# Patient Record
Sex: Female | Born: 1935 | Race: White | Hispanic: No | Marital: Single | Smoking: Former smoker
Health system: Southern US, Community
[De-identification: ages and names within clinical notes are randomized; demographics above are authoritative.]

## PROBLEM LIST (undated history)

## (undated) DIAGNOSIS — N189 Chronic kidney disease, unspecified: Secondary | ICD-10-CM

## (undated) DIAGNOSIS — L719 Rosacea, unspecified: Secondary | ICD-10-CM

## (undated) DIAGNOSIS — M1990 Unspecified osteoarthritis, unspecified site: Secondary | ICD-10-CM

## (undated) DIAGNOSIS — I10 Essential (primary) hypertension: Secondary | ICD-10-CM

## (undated) DIAGNOSIS — K219 Gastro-esophageal reflux disease without esophagitis: Secondary | ICD-10-CM

## (undated) DIAGNOSIS — G459 Transient cerebral ischemic attack, unspecified: Secondary | ICD-10-CM

## (undated) DIAGNOSIS — I359 Nonrheumatic aortic valve disorder, unspecified: Secondary | ICD-10-CM

## (undated) DIAGNOSIS — E119 Type 2 diabetes mellitus without complications: Secondary | ICD-10-CM

## (undated) HISTORY — PX: TONSILLECTOMY: SUR1361

---

## 2007-06-11 ENCOUNTER — Ambulatory Visit: Payer: Self-pay | Admitting: Family Medicine

## 2007-06-13 ENCOUNTER — Ambulatory Visit: Payer: Self-pay | Admitting: Internal Medicine

## 2008-11-29 ENCOUNTER — Ambulatory Visit: Payer: Self-pay | Admitting: Internal Medicine

## 2010-06-26 ENCOUNTER — Ambulatory Visit: Payer: Self-pay | Admitting: Internal Medicine

## 2011-05-23 ENCOUNTER — Ambulatory Visit: Payer: Self-pay

## 2014-06-21 ENCOUNTER — Ambulatory Visit
Admit: 2014-06-21 | Discharge: 2014-06-21 | Disposition: A | Discharge status: Home / Self Care | Payer: Medicare Other | Attending: Family Medicine | Admitting: Family Medicine

## 2014-06-21 ENCOUNTER — Emergency Department
Admission: EM | Admit: 2014-06-21 | Discharge: 2014-06-21 | Disposition: A | Discharge status: Home / Self Care | Payer: Medicare Other | Attending: Emergency Medicine | Admitting: Emergency Medicine

## 2014-06-21 ENCOUNTER — Emergency Department: Payer: Medicare Other

## 2014-06-21 ENCOUNTER — Encounter: Payer: Self-pay | Admitting: Emergency Medicine

## 2014-06-21 ENCOUNTER — Encounter: Payer: Self-pay | Admitting: Family Medicine

## 2014-06-21 ENCOUNTER — Ambulatory Visit (INDEPENDENT_AMBULATORY_CARE_PROVIDER_SITE_OTHER)
Admission: EM | Admit: 2014-06-21 | Discharge: 2014-06-21 | Disposition: A | Discharge status: Home / Self Care | Payer: Medicare Other | Source: Home / Self Care | Attending: Family Medicine | Admitting: Family Medicine

## 2014-06-21 DIAGNOSIS — Z87891 Personal history of nicotine dependence: Secondary | ICD-10-CM | POA: Insufficient documentation

## 2014-06-21 DIAGNOSIS — E041 Nontoxic single thyroid nodule: Secondary | ICD-10-CM

## 2014-06-21 DIAGNOSIS — I82412 Acute embolism and thrombosis of left femoral vein: Secondary | ICD-10-CM | POA: Diagnosis not present

## 2014-06-21 DIAGNOSIS — S40022A Contusion of left upper arm, initial encounter: Secondary | ICD-10-CM | POA: Diagnosis not present

## 2014-06-21 DIAGNOSIS — I82402 Acute embolism and thrombosis of unspecified deep veins of left lower extremity: Secondary | ICD-10-CM

## 2014-06-21 DIAGNOSIS — E119 Type 2 diabetes mellitus without complications: Secondary | ICD-10-CM | POA: Diagnosis not present

## 2014-06-21 DIAGNOSIS — R2242 Localized swelling, mass and lump, left lower limb: Secondary | ICD-10-CM | POA: Diagnosis present

## 2014-06-21 DIAGNOSIS — I82621 Acute embolism and thrombosis of deep veins of right upper extremity: Secondary | ICD-10-CM

## 2014-06-21 DIAGNOSIS — I10 Essential (primary) hypertension: Secondary | ICD-10-CM | POA: Insufficient documentation

## 2014-06-21 DIAGNOSIS — I82622 Acute embolism and thrombosis of deep veins of left upper extremity: Secondary | ICD-10-CM

## 2014-06-21 DIAGNOSIS — M7989 Other specified soft tissue disorders: Secondary | ICD-10-CM

## 2014-06-21 DIAGNOSIS — Z79899 Other long term (current) drug therapy: Secondary | ICD-10-CM | POA: Insufficient documentation

## 2014-06-21 HISTORY — DX: Essential (primary) hypertension: I10

## 2014-06-21 HISTORY — DX: Rosacea, unspecified: L71.9

## 2014-06-21 HISTORY — DX: Type 2 diabetes mellitus without complications: E11.9

## 2014-06-21 HISTORY — DX: Transient cerebral ischemic attack, unspecified: G45.9

## 2014-06-21 LAB — COMPREHENSIVE METABOLIC PANEL
ALBUMIN: 3.5 g/dL (ref 3.5–5.0)
ALK PHOS: 75 U/L (ref 38–126)
ALT: 9 U/L — ABNORMAL LOW (ref 14–54)
AST: 18 U/L (ref 15–41)
Anion gap: 7 (ref 5–15)
BUN: 17 mg/dL (ref 6–20)
CO2: 29 mmol/L (ref 22–32)
Calcium: 9.2 mg/dL (ref 8.9–10.3)
Chloride: 103 mmol/L (ref 101–111)
Creatinine, Ser: 1.33 mg/dL — ABNORMAL HIGH (ref 0.44–1.00)
GFR calc Af Amer: 40 mL/min — ABNORMAL LOW (ref >60)
GFR, EST NON AFRICAN AMERICAN: 35 mL/min — AB (ref >60)
Glucose, Bld: 121 mg/dL — ABNORMAL HIGH (ref 65–99)
POTASSIUM: 3.4 mmol/L — AB (ref 3.5–5.1)
Sodium: 139 mmol/L (ref 135–145)
Total Bilirubin: 0.3 mg/dL (ref 0.3–1.2)
Total Protein: 6.2 g/dL — ABNORMAL LOW (ref 6.5–8.1)

## 2014-06-21 LAB — CBC
HCT: 38.6 % (ref 35.0–47.0)
Hemoglobin: 12.7 g/dL (ref 12.0–16.0)
MCH: 28.5 pg (ref 26.0–34.0)
MCHC: 33 g/dL (ref 32.0–36.0)
MCV: 86.5 fL (ref 80.0–100.0)
PLATELETS: 201 10*3/uL (ref 150–440)
RBC: 4.46 MIL/uL (ref 3.80–5.20)
RDW: 13.8 % (ref 11.5–14.5)
WBC: 7.7 10*3/uL (ref 3.6–11.0)

## 2014-06-21 MED ORDER — RIVAROXABAN 15 MG PO TABS
15.0000 mg | ORAL_TABLET | Freq: Once | ORAL | Status: AC
  Administered 2014-06-21: 15 mg via ORAL
  Filled 2014-06-21: qty 1

## 2014-06-21 MED ORDER — RIVAROXABAN (XARELTO) VTE STARTER PACK (15 & 20 MG): ORAL_TABLET | ORAL | Status: DC

## 2014-06-21 NOTE — ED Provider Notes (Signed)
CSN: [id number]     Arrival date & time 06/21/14  1127 History   First MD Initiated Contact with Patient 06/21/14 1236     Chief Complaint  Patient presents with  . Arm Swelling    today   (Consider location/radiation/quality/duration/timing/severity/associated sxs/prior Treatment) HPI Comments: 81 yo female presents with 5 days h/o left upper extremity bruising and tenderness as well as swelling of extremity since this morning. No other new complaints. Patient states on Sunday took a bath in her bathtub, then tried to get out but couldn't. States her son had to get her out of the bathtub. Woke up with sore muscles the next morning. Since then symptoms have progressed on the left side.   The history is provided by the patient.    Past Medical History  Diagnosis Date  . Diabetes mellitus without complication   . TIA (transient ischemic attack)   . Hypertension   . Rosacea    Past Surgical History  Procedure Laterality Date  . Tonsillectomy     Family History  Problem Relation Age of Onset  . Cancer Mother    History  Substance Use Topics  . Smoking status: Former Games developermoker  . Smokeless tobacco: Not on file  . Alcohol Use: No   OB History    Gravida Para Term Preterm AB TAB SAB Ectopic Multiple Living   7 2             Review of Systems  Allergies  Sulfur  Home Medications   Prior to Admission medications   Medication Sig Start Date End Date Taking? Authorizing Provider  clopidogrel (PLAVIX) 75 MG tablet Take 75 mg by mouth daily.   Yes Historical Provider, MD  doxycycline (MONODOX) 50 MG capsule Take 50 mg by mouth 2 (two) times daily.   Yes Historical Provider, MD  furosemide (LASIX) 10 MG/ML solution Take 10 mg by mouth 2 (two) times daily.   Yes Historical Provider, MD  traMADol (ULTRAM) 50 MG tablet Take 50 mg by mouth every 6 (six) hours as needed.   Yes Historical Provider, MD   BP 140/75 mmHg  Pulse 75  Temp(Src) 97.4 F (36.3 C) (Oral)  Resp 18  Ht 5\' 7"   (1.702 m)  Wt 150 lb (68.04 kg)  BMI 23.49 kg/m2  SpO2 96% Physical Exam  Constitutional: She appears well-developed and well-nourished. No distress.  Cardiovascular: Normal rate, regular rhythm and intact distal pulses.   Murmur heard. Pulmonary/Chest: Effort normal and breath sounds normal. No respiratory distress. She has no wheezes. She has no rales. She exhibits no tenderness.  Musculoskeletal: She exhibits edema and tenderness.  Left upper extremity with diffuse ecchymosis noted on skin over left biceps area; tenderness over this area (soft tissues); no mass palpated; no bony tenderness; extremity neurovascularly intact  Skin: She is not diaphoretic.  Vitals reviewed.   ED Course  Procedures (including critical care time) Labs Review Labs Reviewed - No data to display  Imaging Review No results found.   MDM   1. Contusion, arm, upper, left, initial encounter   2. Left arm swelling    Plan: 1. Diagnosis reviewed with patient; discussed all symptoms could be due to contusion, however can not rule out DVT 2. Recommend referral for venous doppler US of left upper extremity (today; patient scheduled for 2pm today) 3. Recommend supportive treatment with warm compresses to area; elevation 4. F/u prn if symptoms worsen or don't improve    Payton Mccallumrlando Sande Pickert, MD 06/21/14 1425

## 2014-06-21 NOTE — ED Notes (Signed)
Patient to ER for c/o clots to left neck. Patient has ultrasound copy with her from urgent care with clot present in jugular vein, subclavian vein, basilic vein, and brachial vein. Patient states upon waking this am she noticed soreness to left neck with swelling to left arm.

## 2014-06-21 NOTE — Discharge Instructions (Signed)
Take Xarelto for your blood clot in both your left arm and left leg. The blood clot in the left leg is not fully occlusive. Stop taking the Plavix. Follow-up with your regular doctors. Return to the emergency department if you have any worsening symptoms of swelling or pain or other urgent concerns.  Deep Vein Thrombosis A deep vein thrombosis (DVT) is a blood clot that develops in the deep, larger veins of the leg, arm, or pelvis. These are more dangerous than clots that might form in veins near the surface of the body. A DVT can lead to serious and even life-threatening complications if the clot breaks off and travels in the bloodstream to the lungs.  A DVT can damage the valves in your leg veins so that instead of flowing upward, the blood pools in the lower leg. This is called post-thrombotic syndrome, and it can result in pain, swelling, discoloration, and sores on the leg. CAUSES Usually, several things contribute to the formation of blood clots. Contributing factors include:  The flow of blood slows down.  The inside of the vein is damaged in some way.  You have a condition that makes blood clot more easily. RISK FACTORS Some people are more likely than others to develop blood clots. Risk factors include:   Smoking.  Being overweight (obese).  Sitting or lying still for a long time. This includes long-distance travel, paralysis, or recovery from an illness or surgery. Other factors that increase risk are:   Older age, especially over 37 years of age.  Having a family history of blood clots or if you have already had a blot clot.  Having major or lengthy surgery. This is especially true for surgery on the hip, knee, or belly (abdomen). Hip surgery is particularly high risk.  Having a long, thin tube (catheter) placed inside a vein during a medical procedure.  Breaking a hip or leg.  Having cancer or cancer treatment.  Pregnancy and childbirth.  Hormone changes make the  blood clot more easily during pregnancy.  The fetus puts pressure on the veins of the pelvis.  There is a risk of injury to veins during delivery or a caesarean delivery. The risk is highest just after childbirth.  Medicines containing the female hormone estrogen. This includes birth control pills and hormone replacement therapy.  Other circulation or heart problems.  SIGNS AND SYMPTOMS When a clot forms, it can either partially or totally block the blood flow in that vein. Symptoms of a DVT can include:  Swelling of the leg or arm, especially if one side is much worse.  Warmth and redness of the leg or arm, especially if one side is much worse.  Pain in an arm or leg. If the clot is in the leg, symptoms may be more noticeable or worse when standing or walking. The symptoms of a DVT that has traveled to the lungs (pulmonary embolism, PE) usually start suddenly and include:  Shortness of breath.  Coughing.  Coughing up blood or blood-tinged mucus.  Chest pain. The chest pain is often worse with deep breaths.  Rapid heartbeat. Anyone with these symptoms should get emergency medical treatment right away. Do not wait to see if the symptoms will go away. Call your local emergency services (911 in the [place] if you have these symptoms. Do not drive yourself to the hospital. DIAGNOSIS If a DVT is suspected, your health care provider will take a full medical history and perform a physical exam. Tests that also  may be required include:  Blood tests, including studies of the clotting properties of the blood.  Ultrasound to see if you have clots in your legs or lungs.  X-rays to show the flow of blood when dye is injected into the veins (venogram).  Studies of your lungs if you have any chest symptoms. PREVENTION  Exercise the legs regularly. Take a brisk 30-minute walk every day.  Maintain a weight that is appropriate for your height.  Avoid sitting or lying in bed for long periods  of time without moving your legs.  Women, particularly those over the age of 35 years, should consider the risks and benefits of taking estrogen medicines, including birth control pills.  Do not smoke, especially if you take estrogen medicines.  Long-distance travel can increase your risk of DVT. You should exercise your legs by walking or pumping the muscles every hour.  Many of the risk factors above relate to situations that exist with hospitalization, either for illness, injury, or elective surgery. Prevention may include medical and nonmedical measures.  Your health care provider will assess you for the need for venous thromboembolism prevention when you are admitted to the hospital. If you are having surgery, your surgeon will assess you the day of or day after surgery. TREATMENT Once identified, a DVT can be treated. It can also be prevented in some circumstances. Once you have had a DVT, you may be at increased risk for a DVT in the future. The most common treatment for DVT is blood-thinning (anticoagulant) medicine, which reduces the blood's tendency to clot. Anticoagulants can stop new blood clots from forming and stop old clots from growing. They cannot dissolve existing clots. Your body does this by itself over time. Anticoagulants can be given by mouth, through an IV tube, or by injection. Your health care provider will determine the best program for you. Other medicines or treatments that may be used are:  Heparin or related medicines (low molecular weight heparin) are often the first treatment for a blood clot. They act quickly. However, they cannot be taken orally and must be given either in shot form or by IV tube.  Heparin can cause a fall in a component of blood that stops bleeding and forms blood clots (platelets). You will be monitored with blood tests to be sure this does not occur.  Warfarin is an anticoagulant that can be swallowed. It takes a few days to start working, so  usually heparin or related medicines are used in combination. Once warfarin is working, heparin is usually stopped.  Factor Xa inhibitor medicines, such as rivaroxaban and apixaban, also reduce blood clotting. These medicines are taken orally and can often be used without heparin or related medicines.  Less commonly, clot dissolving drugs (thrombolytics) are used to dissolve a DVT. They carry a high risk of bleeding, so they are used mainly in severe cases where your life or a part of your body is threatened.  Very rarely, a blood clot in the leg needs to be removed surgically.  If you are unable to take anticoagulants, your health care provider may arrange for you to have a filter placed in a main vein in your abdomen. This filter prevents clots from traveling to your lungs. HOME CARE INSTRUCTIONS  Take all medicines as directed by your health care provider.  Learn as much as you can about DVT.  Wear a medical alert bracelet or carry a medical alert card.  Ask your health care provider how soon  you can go back to normal activities. It is important to stay active to prevent blood clots. If you are on anticoagulant medicine, avoid contact sports.  It is very important to exercise. This is especially important while traveling, sitting, or standing for long periods of time. Exercise your legs by walking or by tightening and relaxing your leg muscles regularly. Take frequent walks.  You may need to wear compression stockings. These are tight elastic stockings that apply pressure to the lower legs. This pressure can help keep the blood in the legs from clotting. Taking Warfarin Warfarin is a daily medicine that is taken by mouth. Your health care provider will advise you on the length of treatment (usually 3-6 months, sometimes lifelong). If you take warfarin:  Understand how to take warfarin and foods that can affect how warfarin works in Public relations account executive.  Too much and too little warfarin are both  dangerous. Too much warfarin increases the risk of bleeding. Too little warfarin continues to allow the risk for blood clots. Warfarin and Regular Blood Testing While taking warfarin, you will need to have regular blood tests to measure your blood clotting time. These blood tests usually include both the prothrombin time (PT) and international normalized ratio (INR) tests. The PT and INR results allow your health care provider to adjust your dose of warfarin. It is very important that you have your PT and INR tested as often as directed by your health care provider.  Warfarin and Your Diet Avoid major changes in your diet, or notify your health care provider before changing your diet. Arrange a visit with a registered dietitian to answer your questions. Many foods, especially foods high in vitamin K, can interfere with warfarin and affect the PT and INR results. You should eat a consistent amount of foods high in vitamin K. Foods high in vitamin K include:   Spinach, kale, broccoli, cabbage, collard and turnip greens, Brussels sprouts, peas, cauliflower, seaweed, and parsley.  Beef and pork liver.  Green tea.  Soybean oil. Warfarin with Other Medicines Many medicines can interfere with warfarin and affect the PT and INR results. You must:  Tell your health care provider about any and all medicines, vitamins, and supplements you take, including aspirin and other over-the-counter anti-inflammatory medicines. Be especially cautious with aspirin and anti-inflammatory medicines. Ask your health care provider before taking these.  Do not take or discontinue any prescribed or over-the-counter medicine except on the advice of your health care provider or pharmacist. Warfarin Side Effects Warfarin can have side effects, such as easy bruising and difficulty stopping bleeding. Ask your health care provider or pharmacist about other side effects of warfarin. You will need to:  Hold pressure over cuts for  longer than usual.  Notify your dentist and other health care providers that you are taking warfarin before you undergo any procedures where bleeding may occur. Warfarin with Alcohol and Tobacco   Drinking alcohol frequently can increase the effect of warfarin, leading to excess bleeding. It is best to avoid alcoholic drinks or to consume only very small amounts while taking warfarin. Notify your health care provider if you change your alcohol intake.   Do not use any tobacco products including cigarettes, chewing tobacco, or electronic cigarettes. If you smoke, quit. Ask your health care provider for help with quitting smoking. Alternative Medicines to Warfarin: Factor Xa Inhibitor Medicines  These blood-thinning medicines are taken by mouth, usually for several weeks or longer. It is important to take the medicine  every single day at the same time each day.  There are no regular blood tests required when using these medicines.  There are fewer food and drug interactions than with warfarin.  The side effects of this class of medicine are similar to those of warfarin, including excessive bruising or bleeding. Ask your health care provider or pharmacist about other potential side effects. SEEK MEDICAL CARE IF:  You notice a rapid heartbeat.  You feel weaker or more tired than usual.  You feel faint.  You notice increased bruising.  You feel your symptoms are not getting better in the time expected.  You believe you are having side effects of medicine. SEEK IMMEDIATE MEDICAL CARE IF:  You have chest pain.  You have trouble breathing.  You have new or increased swelling or pain in one leg.  You cough up blood.  You notice blood in vomit, in a bowel movement, or in urine. MAKE SURE YOU:  Understand these instructions.  Will watch your condition.  Will get help right away if you are not doing well or get worse. Document Released: 01/18/2005 Document Revised: 06/04/2013  Document Reviewed: 09/25/2012 [facility] Patient Information 2015 [employer] [place], [place]. This information is not intended to replace advice given to you by your health care provider. Make sure you discuss any questions you have with your health care provider.

## 2014-06-21 NOTE — ED Notes (Signed)
Patient states she woke up this morning with a swollen left arm. She has had some soreness in the shoulder for couple days but no swelling. She has a lump on the bicep that has come up today.

## 2014-06-21 NOTE — ED Provider Notes (Signed)
[facility] Emergency Department Provider Note  ____________________________________________  Time seen: 1700  I have reviewed the triage vital signs and the nursing notes.   HISTORY  Chief Complaint DVT     HPI Carolyn Morales is a 81 y.o. female who is complaining of swelling in her left arm. She wasn't sure if she had injured it while taking a or not on Sunday. Since then she has had some bruising and discomfort. Including discomfort in her neck.  [facility]. They ordered a Doppler ultrasound as an outpatient. This ultrasound done at 2:00 today was positive for DVT. She now presents to the emergency department for any further treatment.     During our interview, the family points out that she has had some problems and swelling with her left knee and leg as well. This was not either detected or addressed earlier.   Past Medical History  Diagnosis Date  . Diabetes mellitus without complication   . TIA (transient ischemic attack)   . Hypertension   . Rosacea     There are no active problems to display for this patient.   Past Surgical History  Procedure Laterality Date  . Tonsillectomy      Current Outpatient Rx  Name  Route  Sig  Dispense  Refill  . clopidogrel (PLAVIX) 75 MG tablet   Oral   Take 75 mg by mouth daily.         Marland Kitchen. doxycycline (MONODOX) 50 MG capsule   Oral   Take 50 mg by mouth 2 (two) times daily.         . furosemide (LASIX) 10 MG/ML solution   Oral   Take 10 mg by mouth 2 (two) times daily.         . traMADol (ULTRAM) 50 MG tablet   Oral   Take 50 mg by mouth every 6 (six) hours as needed.         . Rivaroxaban (XARELTO STARTER PACK) 15 & 20 MG TBPK      Take as directed on package: Start with one 15mg  tablet by mouth twice a day with food. On Day 22, switch to one 20mg  tablet once a day with food.   51 each   0     Allergies Sulfur  Family History  Problem Relation Age of  Onset  . Cancer Mother     Social History History  Substance Use Topics  . Smoking status: Former Games developermoker  . Smokeless tobacco: Not on file  . Alcohol Use: No    Review of Systems  Constitutional: Negative for fever. ENT: Negative for sore throat. Cardiovascular: Negative for chest pain. Respiratory: Negative for shortness of breath. Gastrointestinal: Negative for abdominal pain, vomiting and diarrhea. Genitourinary: Negative for dysuria. Musculoskeletal: Swelling to left arm. There is also swelling noted to the left knee.. Skin: Negative for rash. Neurological: Negative for headaches   10-point ROS otherwise negative.  ____________________________________________   PHYSICAL EXAM:  VITAL SIGNS: ED Triage Vitals  Enc Vitals Group     BP 06/21/14 1631 160/69 mmHg     Pulse Rate 06/21/14 1631 72     Resp 06/21/14 1631 20     Temp 06/21/14 1631 97.9 F (36.6 C)     Temp Source 06/21/14 1631 Oral     SpO2 06/21/14 1631 99 %     Weight 06/21/14 1631 150 lb (68.04 kg)     Height 06/21/14 1631  5\' 2"  (1.575 m)     Head Cir --      Peak Flow --      Pain Score 06/21/14 1632 0     Pain Loc --      Pain Edu? --      Excl. in GC? --     Constitutional: Alert and oriented. Well appearing and in no distress. ENT   Head: Normocephalic and atraumatic.   Nose: No congestion/rhinnorhea.   Mouth/Throat: Mucous membranes are moist. Cardiovascular: Normal rate, regular rhythm. Notable cardiac murmur 2/6.  Respiratory: Normal respiratory effort without tachypnea. Breath sounds are clear and equal bilaterally. No wheezes/rales/rhonchi. Gastrointestinal: Soft and nontender. No distention.  Back: There is no CVA tenderness. Musculoskeletal: Mild swelling to the left arm down to the wrist. There is swelling to the left knee area with minimal edema in the lower leg as well. Neurologic:  Normal speech and language. No gross focal neurologic deficits are appreciated.  Skin:   Skin is warm, dry. No rash noted. There is notable ecchymosis in the left upper arm near the deltoid. Psychiatric: Mood and affect are normal. Speech and behavior are normal.  ____________________________________________    LABS (pertinent positives/negatives)   White count is okay with white blood cell count of 7.7 hemoglobin of 12.7 and platelets of 201. Metabolic panel shows potassium of 3.4, glucose at 121. BUN is 17 with creatinine of 1.3. ____________________________________________    ____________________________________________    RADIOLOGY  Outpatient Doppler of the left upper extremity positive for DVT. Emergency department ultrasound Doppler of the left lower extremity pending.  ----------------------------------------- 7:47 PM on 06/21/2014 -----------------------------------------  Ultrasound of left lower leg shows nonocclusive blood clot present.  ____________________________________________   PROCEDURES   ____________________________________________   INITIAL IMPRESSION / ASSESSMENT AND PLAN / ED COURSE  Patient was noted outpatient ultrasound showing DVT in the left upper extremity. Now with further interview and examination, the patient has a high likelihood of having a DVT in the left lower extremity as well. For clarity of diagnosis we will do an ultrasound now in the emergency department. I had discussed starting anticoagulation with this patient and her family. They understand the risks of anticoagulation and the need for it. The patient does currently take Plavix. We will speak with vascular surgery about whether to continue this or to discontinue it.  ____________________________________________   FINAL CLINICAL IMPRESSION(S) / ED DIAGNOSES  Final diagnoses:  DVT of upper extremity (deep vein thrombosis), right  DVT of lower extremity (deep venous thrombosis), left  Left leg swelling      Darien Ramusavid W Destane Speas, MD 06/21/14 2002

## 2015-11-06 IMAGING — US US EXTREM  UP VENOUS*L*
1 series · 13 of 24 positions shown · non-contrast
Comparison: None.

CLINICAL DATA: Left upper extremity swelling



[Series 1: us extrem up venous*left* · 0.07mm/px · 13 of 74 slices shown]
[im 1/74]
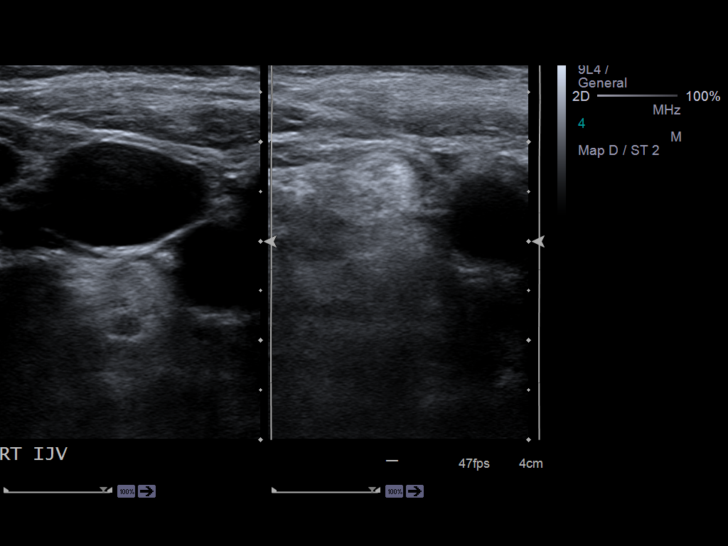
[im 7/74]
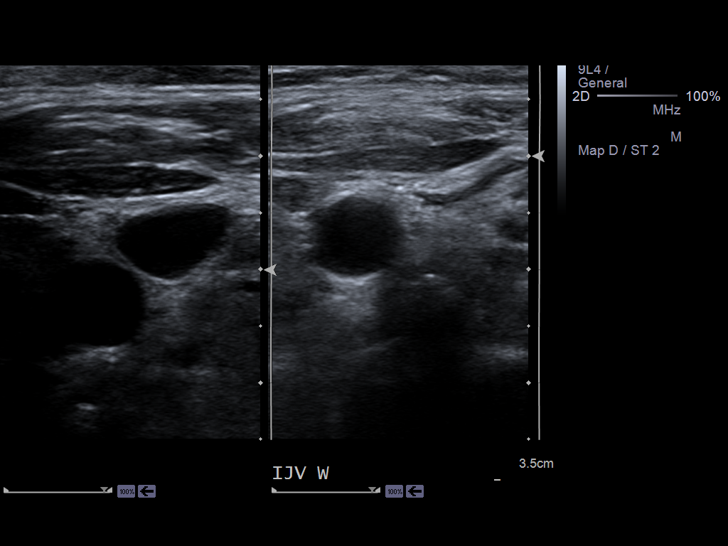
[im 13/74]
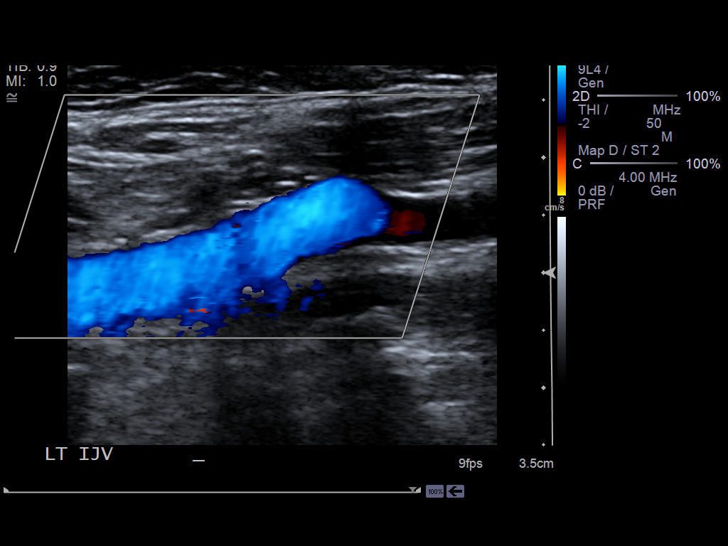
[im 20/74]
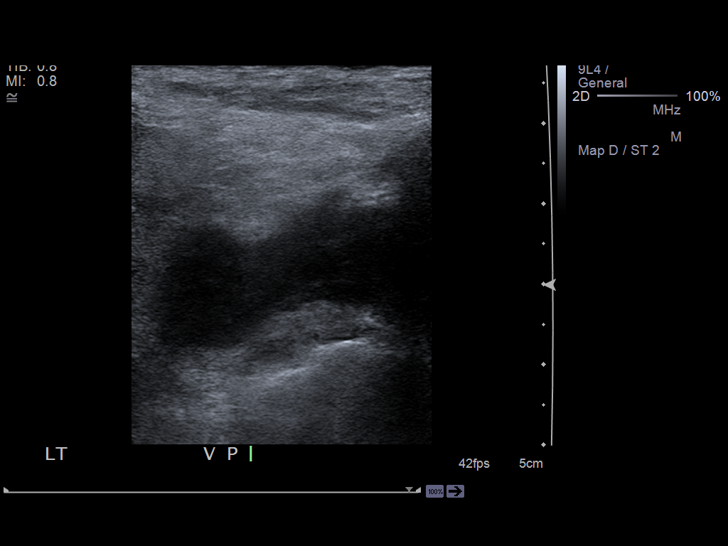
[im 26/74]
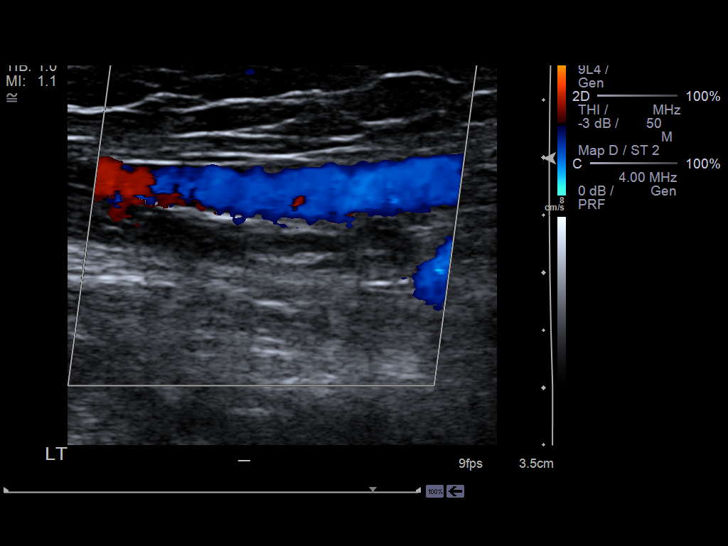
[im 32/74]
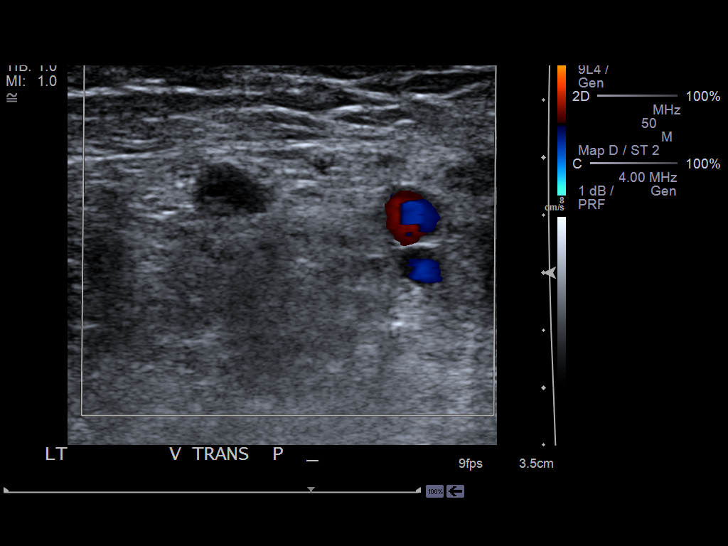
[im 39/74]
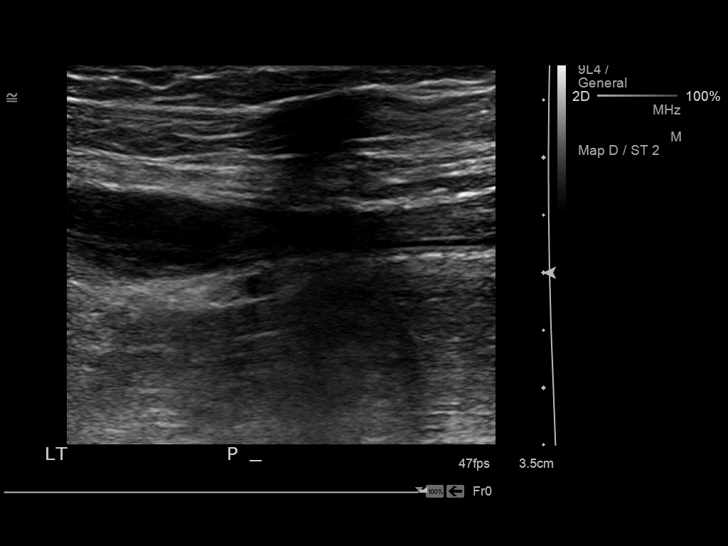
[im 42/74]
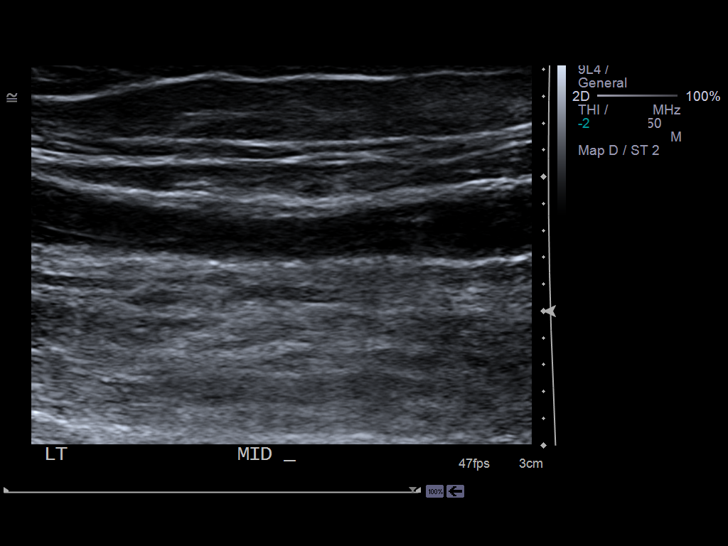
[im 48/74]
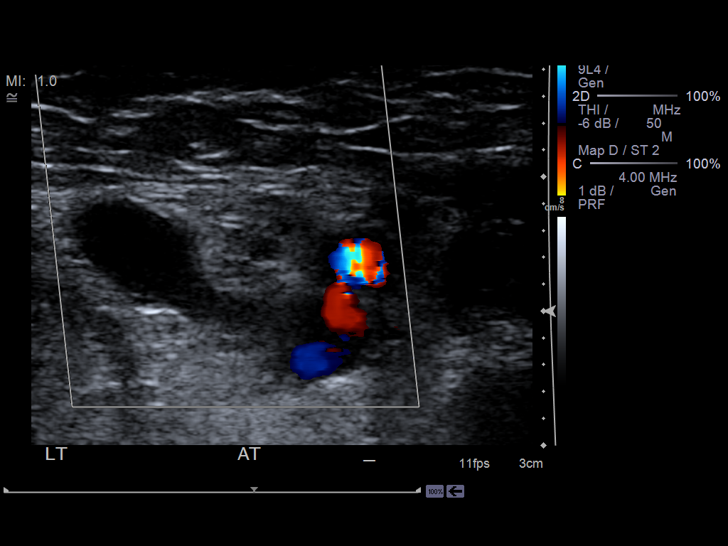
[im 54/74]
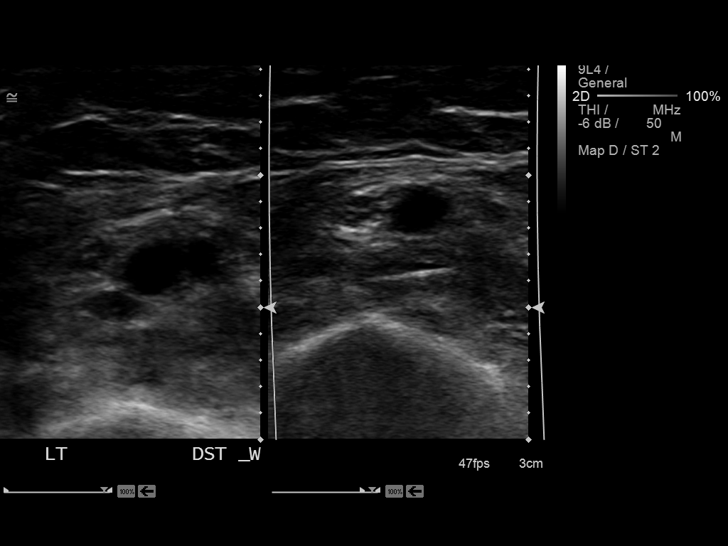
[im 61/74]
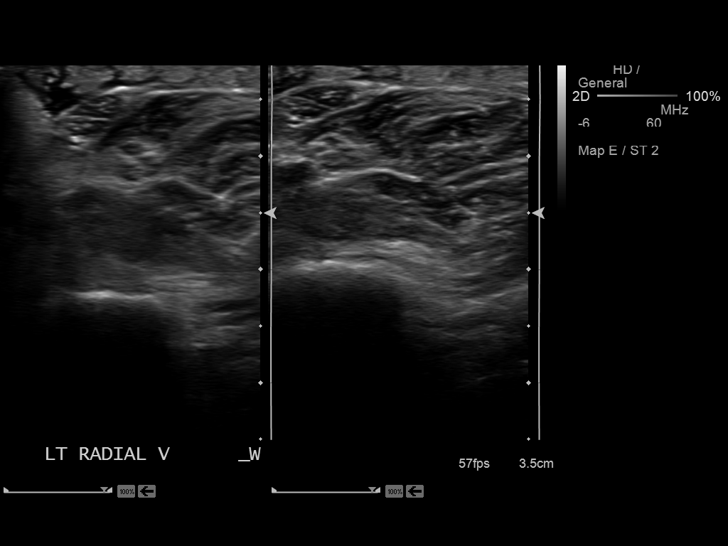
[im 67/74]
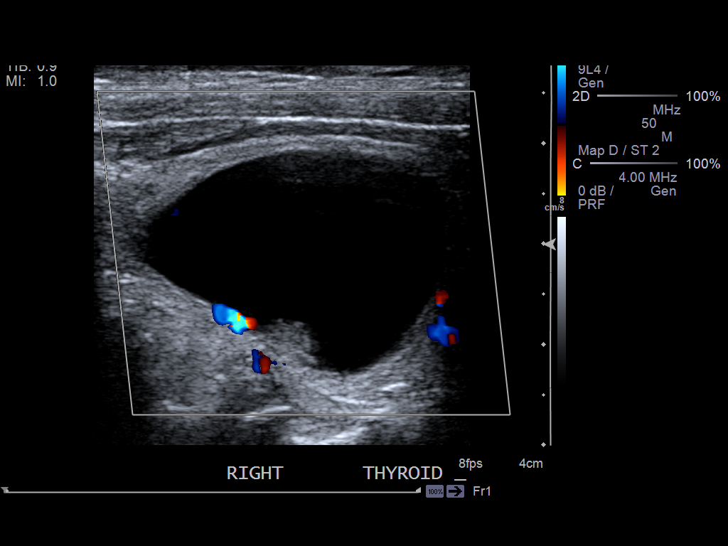
[im 74/74]
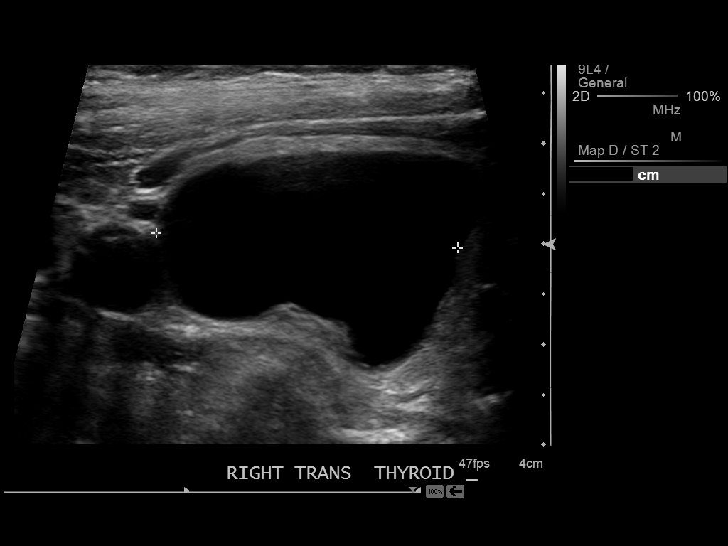

[13 of 24 positions shown; findings below may reference images not displayed]

FINDINGS: Contralateral Subclavian Vein: Respiratory phasicity is normal and
symmetric with the symptomatic side. No evidence of thrombus. Normal
compressibility.

Internal Jugular Vein: Thrombus is noted within the internal jugular
vein with decreased compressibility.

Subclavian Vein: Thrombus is noted within the subclavian vein on the
left.

Axillary Vein: No evidence of thrombus. Normal compressibility,
respiratory phasicity and response to augmentation.

Cephalic Vein: No evidence of thrombus. Normal compressibility,
respiratory phasicity and response to augmentation.

Basilic Vein: Diffuse thrombus is noted throughout the basilic vein.

Brachial Veins: Thrombus is noted within the central right heel pain

Radial Veins: Not well visualized due to peripheral edema

Ulnar Veins: Not well visualized due to peripheral edema.

Venous Reflux:  None visualized.

Other Findings: A large cyst is noted within the right lobe of the
thyroid measuring 3.5 cm in greatest dimension. Only minimal mural
component is noted. This likely represents a degenerated thyroid
nodule.
IMPRESSION: Diffuse left upper extremity deep venous thrombosis as described.

Cystic lesion within the right lobe of the thyroid likely
representing a degenerated nodule.

These results will be called to the ordering clinician or
representative by the Radiologist Assistant, and communication
documented in the PACS or zVision Dashboard.

## 2016-01-21 ENCOUNTER — Ambulatory Visit
Admission: EM | Admit: 2016-01-21 | Discharge: 2016-01-21 | Disposition: A | Discharge status: Home / Self Care | Payer: Medicare Other | Attending: Emergency Medicine | Admitting: Emergency Medicine

## 2016-01-21 ENCOUNTER — Ambulatory Visit: Payer: Medicare Other

## 2016-01-21 ENCOUNTER — Encounter: Payer: Self-pay | Admitting: Emergency Medicine

## 2016-01-21 DIAGNOSIS — W230XXA Caught, crushed, jammed, or pinched between moving objects, initial encounter: Secondary | ICD-10-CM | POA: Diagnosis not present

## 2016-01-21 DIAGNOSIS — R200 Anesthesia of skin: Secondary | ICD-10-CM | POA: Diagnosis present

## 2016-01-21 DIAGNOSIS — N189 Chronic kidney disease, unspecified: Secondary | ICD-10-CM | POA: Insufficient documentation

## 2016-01-21 DIAGNOSIS — I358 Other nonrheumatic aortic valve disorders: Secondary | ICD-10-CM | POA: Diagnosis not present

## 2016-01-21 DIAGNOSIS — L719 Rosacea, unspecified: Secondary | ICD-10-CM | POA: Diagnosis not present

## 2016-01-21 DIAGNOSIS — M1990 Unspecified osteoarthritis, unspecified site: Secondary | ICD-10-CM | POA: Diagnosis not present

## 2016-01-21 DIAGNOSIS — Z87891 Personal history of nicotine dependence: Secondary | ICD-10-CM | POA: Diagnosis not present

## 2016-01-21 DIAGNOSIS — K219 Gastro-esophageal reflux disease without esophagitis: Secondary | ICD-10-CM | POA: Insufficient documentation

## 2016-01-21 DIAGNOSIS — I129 Hypertensive chronic kidney disease with stage 1 through stage 4 chronic kidney disease, or unspecified chronic kidney disease: Secondary | ICD-10-CM | POA: Diagnosis not present

## 2016-01-21 DIAGNOSIS — S61211A Laceration without foreign body of left index finger without damage to nail, initial encounter: Secondary | ICD-10-CM | POA: Insufficient documentation

## 2016-01-21 DIAGNOSIS — Z8673 Personal history of transient ischemic attack (TIA), and cerebral infarction without residual deficits: Secondary | ICD-10-CM | POA: Diagnosis not present

## 2016-01-21 DIAGNOSIS — E1122 Type 2 diabetes mellitus with diabetic chronic kidney disease: Secondary | ICD-10-CM | POA: Insufficient documentation

## 2016-01-21 HISTORY — DX: Gastro-esophageal reflux disease without esophagitis: K21.9

## 2016-01-21 HISTORY — DX: Nonrheumatic aortic valve disorder, unspecified: I35.9

## 2016-01-21 HISTORY — DX: Chronic kidney disease, unspecified: N18.9

## 2016-01-21 HISTORY — DX: Unspecified osteoarthritis, unspecified site: M19.90

## 2016-01-21 MED ORDER — TETANUS-DIPHTHERIA TOXOIDS TD 5-2 LFU IM INJ
0.5000 mL | INJECTION | Freq: Once | INTRAMUSCULAR | Status: AC
  Administered 2016-01-21: 0.5 mL via INTRAMUSCULAR

## 2016-01-21 NOTE — ED Triage Notes (Signed)
Patient states that she is unsure how she cut her finger.  Patient states that she got out of the car and noticed her left 2nd finger was bleeding.

## 2016-01-21 NOTE — Discharge Instructions (Signed)
Return in 10 days for suture removal.

## 2016-01-21 NOTE — ED Provider Notes (Signed)
CSN: [id number]     Arrival date & time 01/21/16  1910 History   First MD Initiated Contact with Patient 01/21/16 1953     Chief Complaint  Patient presents with  . Laceration   (Consider location/radiation/quality/duration/timing/severity/associated sxs/prior Treatment) Patient is a 82 y.o. Female, on aspirin, presents today for laceration to her left index finger on the distal phalange. Bleeding is controlled. Laceration occurred at 6 pm today. Patient is unsure how the laceration occurred but states that she noticed the bleeding when she got out of her car, stating that it was dark outside and she couldn't see well in the dark. Patient believes that she may have slammed the finger on the car door. Last tetanus unknown. Patient reports pain currently to be tolerable. Patient has full ROM. She has numbness at the area of laceration but daughter states that she has numbness on her fingers as baseline.         Past Medical History:  Diagnosis Date  . Aortic valve disorder   . Arthritis   . Chronic kidney disease   . Diabetes mellitus without complication (HCC)   . GERD (gastroesophageal reflux disease)   . Hypertension   . Rosacea   . TIA (transient ischemic attack)    Past Surgical History:  Procedure Laterality Date  . TONSILLECTOMY     Family History  Problem Relation Age of Onset  . Cancer Mother    Social History  Substance Use Topics  . Smoking status: Former Games developermoker  . Smokeless tobacco: Never Used  . Alcohol use No   OB History    Gravida Para Term Preterm AB Living   7 2           SAB TAB Ectopic Multiple Live Births                 Review of Systems  All other systems reviewed and are negative.   Allergies  Atorvastatin; Clindamycin/lincomycin; Simvastatin; Sulfa antibiotics; and Sulfur  Home Medications   Prior to Admission medications   Medication Sig Start Date End Date Taking? Authorizing Provider  apixaban (ELIQUIS) 2.5 MG TABS tablet Take 2.5  mg by mouth 2 (two) times daily.   Yes Historical Provider, MD  aspirin EC 81 MG tablet Take 81 mg by mouth daily.   Yes Historical Provider, MD  CALCIUM CARBONATE PO Take 168 mg by mouth daily.   Yes Historical Provider, MD  Cholecalciferol (VITAMIN D3) 1000 units CAPS Take 1 capsule by mouth daily.   Yes Historical Provider, MD  digoxin (LANOXIN) 0.125 MG tablet Take 0.125 mg by mouth daily.   Yes Historical Provider, MD  doxycycline (PERIOSTAT) 20 MG tablet Take 20 mg by mouth 2 (two) times daily.   Yes Historical Provider, MD  ezetimibe (ZETIA) 10 MG tablet Take 10 mg by mouth daily.   Yes Historical Provider, MD  furosemide (LASIX) 20 MG tablet Take 10 mg by mouth 2 (two) times daily.   Yes Historical Provider, MD  metoprolol succinate (TOPROL-XL) 25 MG 24 hr tablet Take 25 mg by mouth daily.   Yes Historical Provider, MD  nitroGLYCERIN (NITROSTAT) 0.4 MG SL tablet Place 0.4 mg under the tongue every 5 (five) minutes as needed for chest pain.   Yes Historical Provider, MD  omega-3 acid ethyl esters (LOVAZA) 1 g capsule Take 1 g by mouth daily.   Yes Historical Provider, MD  pantoprazole (PROTONIX) 40 MG tablet Take 40 mg by mouth daily.   Yes Historical  Provider, MD  potassium chloride (K-DUR) 10 MEQ tablet Take 10 mEq by mouth daily.   Yes Historical Provider, MD  quiNINE (QUALAQUIN) 324 MG capsule Take 648 mg by mouth 3 (three) times daily.   Yes Historical Provider, MD  traMADol (ULTRAM) 50 MG tablet Take 50 mg by mouth every 6 (six) hours as needed.    Historical Provider, MD   Meds Ordered and Administered this Visit   Medications  tetanus & diphtheria toxoids (adult) (TENIVAC) injection 0.5 mL (0.5 mLs Intramuscular Given 01/21/16 2058)    BP (!) 138/56 (BP Location: Left Arm)   Pulse 66   Temp 97.8 F (36.6 C) (Oral)   Resp 16   Ht 5\' 2"  (1.575 m)   Wt 150 lb (68 kg)   SpO2 94%   BMI 27.44 kg/m  No data found.   Physical Exam  Constitutional: She appears well-developed  and well-nourished.  Cardiovascular: Normal rate.   Pulmonary/Chest: Effort normal.  Skin:  A curved Laceration 1.5 cm noted at left index finger over the proximal nail fold. Bleeding controlled. No foreign object noted  Nursing note and vitals reviewed.   Urgent Care Course   Clinical Course     .Marland Kitchen.Laceration Repair Date/Time: 01/21/2016 9:01 PM Performed by: Lucia EstelleZHENG, Adonica Fukushima Authorized by: Domenick GongMORTENSON, ASHLEY   Consent:    Consent obtained:  Verbal   Consent given by:  Patient   Risks discussed:  Infection, need for additional repair, poor wound healing and poor cosmetic result   Alternatives discussed:  No treatment Anesthesia (see MAR for exact dosages):    Anesthesia method:  None Laceration details:    Location:  Finger   Finger location:  L index finger   Length (cm):  1.5 Repair type:    Repair type:  Simple Pre-procedure details:    Preparation:  Patient was prepped and draped in usual sterile fashion Exploration:    Hemostasis achieved with:  Direct pressure   Wound exploration: wound explored through full range of motion   Treatment:    Wound cleansed with: Sterile saline, chlorhexidine, and alcohol.   Amount of cleaning:  Standard   Irrigation solution:  Sterile saline Skin repair:    Repair method:  Sutures   Suture size:  4-0   Suture material:  Nylon   Suture technique:  Simple interrupted   Number of sutures:  3 Approximation:    Approximation:  Close   Vermilion border: well-aligned   Post-procedure details:    Dressing:  Sterile dressing   Patient tolerance of procedure:  Tolerated well, no immediate complications   (including critical care time)  Labs Review Labs Reviewed - No data to display  Imaging Review Dg Finger Index Left  Result Date: 01/21/2016 CLINICAL DATA:  Car door crush injury with deep laceration. EXAM: LEFT INDEX FINGER 2+V COMPARISON:  None. FINDINGS: Negative for acute fracture, dislocation or radiopaque foreign body.  Osteoarthritic changes are present at the interphalangeal joints. IMPRESSION: Negative. Electronically Signed   By: Ellery Plunkaniel R Mitchell M.D.   On: 01/21/2016 20:23    MDM   1. Laceration of left index finger without foreign body without damage to nail, initial encounter    1) Xray negative for fracture 2) Tetanus updated today 3) Dr. Chaney MallingMortenson in to see. Laceration repaired by Dr. Chaney MallingMortenson. See procedure note above.  4) Laceration care handout given. Return in 10 days for suture removal.      Lucia EstelleFeng Nichael Ehly, NP 01/21/16 2102

## 2016-01-31 ENCOUNTER — Ambulatory Visit
Admission: EM | Admit: 2016-01-31 | Discharge: 2016-01-31 | Disposition: A | Discharge status: Home / Self Care | Payer: Medicare Other

## 2016-05-22 ENCOUNTER — Ambulatory Visit
Admission: EM | Admit: 2016-05-22 | Discharge: 2016-05-22 | Disposition: A | Discharge status: Home / Self Care | Payer: Medicare Other | Attending: Family Medicine | Admitting: Family Medicine

## 2016-05-22 DIAGNOSIS — L739 Follicular disorder, unspecified: Secondary | ICD-10-CM | POA: Diagnosis not present

## 2016-05-22 MED ORDER — DOXYCYCLINE HYCLATE 50 MG PO CAPS: 50.0000 mg | ORAL_CAPSULE | Freq: Two times a day (BID) | ORAL | 0 refills | Status: AC

## 2016-05-22 NOTE — ED Triage Notes (Signed)
Pt states started a couple weeks ago with scalp itching and tenderness to touch. Has gotten worse recently.

## 2016-05-22 NOTE — ED Provider Notes (Signed)
[facility]    CSN: [id number] Arrival date & time: 05/22/16  0955     History   Chief Complaint Chief Complaint  Patient presents with  . Hair/Scalp Problem    HPI Carolyn Morales is a 83 y.o. female.   83 yo female with a c/o scalp and back of neck itching, tenderness and skin lesions for the past 2-3 weeks.  States this is a chronic problem for her and in the past had been prescribed doxycycline chronically by her dermatologist. Patient states that recently she was in a rehabilitation facility and her chronic doxycycline was discontinued and not restarted by another physician, however patient reports not having any adverse effects or other problems with taking the doxycycline.    The history is provided by the patient.    Past Medical History:  Diagnosis Date  . Aortic valve disorder   . Arthritis   . Chronic kidney disease   . Diabetes mellitus without complication (HCC)   . GERD (gastroesophageal reflux disease)   . Hypertension   . Rosacea   . TIA (transient ischemic attack)     There are no active problems to display for this patient.   Past Surgical History:  Procedure Laterality Date  . TONSILLECTOMY      OB History    Gravida Para Term Preterm AB Living   7 2           SAB TAB Ectopic Multiple Live Births                   Home Medications    Prior to Admission medications   Medication Sig Start Date End Date Taking? Authorizing Provider  propranolol (INDERAL) 10 MG tablet Take 10 mg by mouth 2 (two) times daily.   Yes Historical Provider, MD  apixaban (ELIQUIS) 2.5 MG TABS tablet Take 2.5 mg by mouth 2 (two) times daily.    Historical Provider, MD  aspirin EC 81 MG tablet Take 81 mg by mouth daily.    Historical Provider, MD  CALCIUM CARBONATE PO Take 168 mg by mouth daily.    Historical Provider, MD  Cholecalciferol (VITAMIN D3) 1000 units CAPS Take 1 capsule by mouth daily.    Historical Provider, MD  digoxin (LANOXIN) 0.125 MG tablet  Take 0.125 mg by mouth daily.    Historical Provider, MD  doxycycline (VIBRAMYCIN) 50 MG capsule Take 1 capsule (50 mg total) by mouth 2 (two) times daily. 05/22/16   Payton Mccallum, MD  ezetimibe (ZETIA) 10 MG tablet Take 10 mg by mouth daily.    Historical Provider, MD  furosemide (LASIX) 20 MG tablet Take 10 mg by mouth 2 (two) times daily.    Historical Provider, MD  metoprolol succinate (TOPROL-XL) 25 MG 24 hr tablet Take 25 mg by mouth daily.    Historical Provider, MD  nitroGLYCERIN (NITROSTAT) 0.4 MG SL tablet Place 0.4 mg under the tongue every 5 (five) minutes as needed for chest pain.    Historical Provider, MD  omega-3 acid ethyl esters (LOVAZA) 1 g capsule Take 1 g by mouth daily.    Historical Provider, MD  pantoprazole (PROTONIX) 40 MG tablet Take 40 mg by mouth daily.    Historical Provider, MD  potassium chloride (K-DUR) 10 MEQ tablet Take 10 mEq by mouth daily.    Historical Provider, MD  quiNINE (QUALAQUIN) 324 MG capsule Take 648 mg by mouth 3 (three) times daily.    Historical Provider, MD  traMADol Janean Sark)  50 MG tablet Take 50 mg by mouth every 6 (six) hours as needed.    Historical Provider, MD    Family History Family History  Problem Relation Age of Onset  . Cancer Mother     Social History Social History  Substance Use Topics  . Smoking status: Former Games developer  . Smokeless tobacco: Never Used  . Alcohol use No     Allergies   Atorvastatin; Clindamycin/lincomycin; Simvastatin; Sulfa antibiotics; and Sulfur   Review of Systems Review of Systems   Physical Exam Triage Vital Signs ED Triage Vitals  Enc Vitals Group     BP 05/22/16 1033 (!) 155/63     Pulse Rate 05/22/16 1033 67     Resp 05/22/16 1033 18     Temp 05/22/16 1033 97.6 F (36.4 C)     Temp Source 05/22/16 1033 Oral     SpO2 05/22/16 1033 98 %     Weight 05/22/16 1031 130 lb (59 kg)     Height 05/22/16 1031  (1.575 m)     Head Circumference --      Peak Flow --      Pain Score  05/22/16 1032 3     Pain Loc --      Pain Edu? --      Excl. in GC? --    No data found.   Updated Vital Signs BP (!) 155/63 (BP Location: Left Arm)   Pulse 67   Temp 97.6 F (36.4 C) (Oral)   Resp 18   Ht  (1.575 m)   Wt 130 lb (59 kg)   SpO2 98%   BMI 23.78 kg/m   Visual Acuity Right Eye Distance:   Left Eye Distance:   Bilateral Distance:    Right Eye Near:   Left Eye Near:    Bilateral Near:     Physical Exam  Constitutional: She appears well-developed and well-nourished. No distress.  Skin: Lesion and rash noted. Rash is pustular. She is not diaphoretic.  Pinpoint and small papular lesions on scalp and back of neck skin  Nursing note and vitals reviewed.    UC Treatments / Results  Labs (all labs ordered are listed, but only abnormal results are displayed) Labs Reviewed - No data to display  EKG  EKG Interpretation None       Radiology No results found.  Procedures Procedures (including critical care time)  Medications Ordered in UC Medications - No data to display   Initial Impression / Assessment and Plan / UC Course  I have reviewed the triage vital signs and the nursing notes.  Pertinent labs & imaging results that were available during my care of the patient were reviewed by me and considered in my medical decision making (see chart for details).       Final Clinical Impressions(s) / UC Diagnoses   Final diagnoses:  Chronic folliculitis    New Prescriptions Discharge Medication List as of 05/22/2016 11:02 AM    START taking these medications   Details  doxycycline (VIBRAMYCIN) 50 MG capsule Take 1 capsule (50 mg total) by mouth 2 (two) times daily., Starting Sat 05/22/2016, Normal       1. diagnosis reviewed with patient 2. rx as per orders above; reviewed possible side effects, interactions, risks and benefits  3. Follow-up with her PCP and dermatologist for further management (and/or med refills) 4. Follow up prn if  symptoms worsen or don't improve   Payton Mccallum, MD 05/22/16 1232

## 2017-06-07 IMAGING — CR DG FINGER INDEX 2+V*L*
3 series · 3 of 3 positions shown · non-contrast
Comparison: None.

CLINICAL DATA: Car door crush injury with deep laceration.

EXAM:
LEFT INDEX FINGER 2+V

[finger ap]
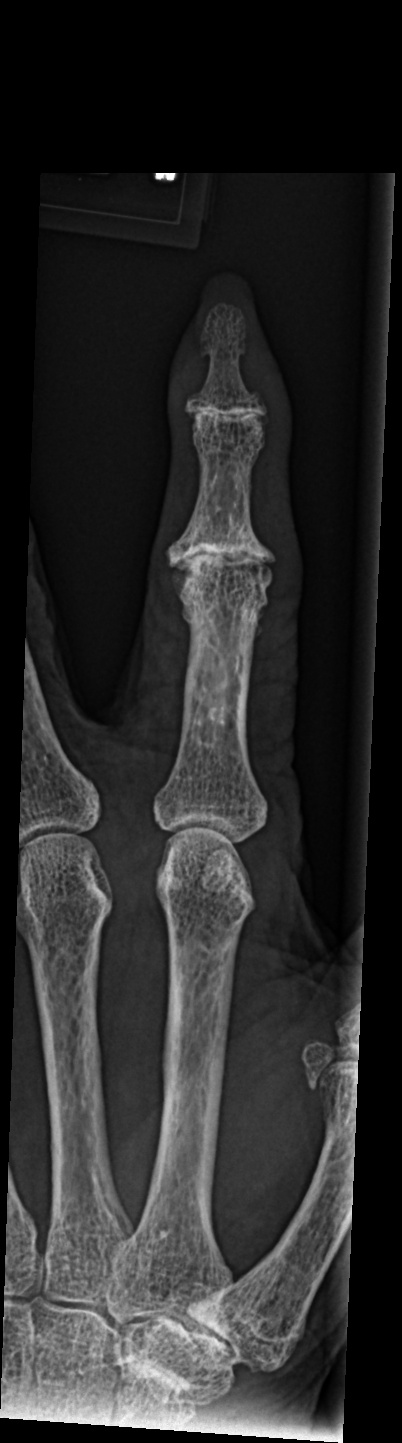

[finger lat]
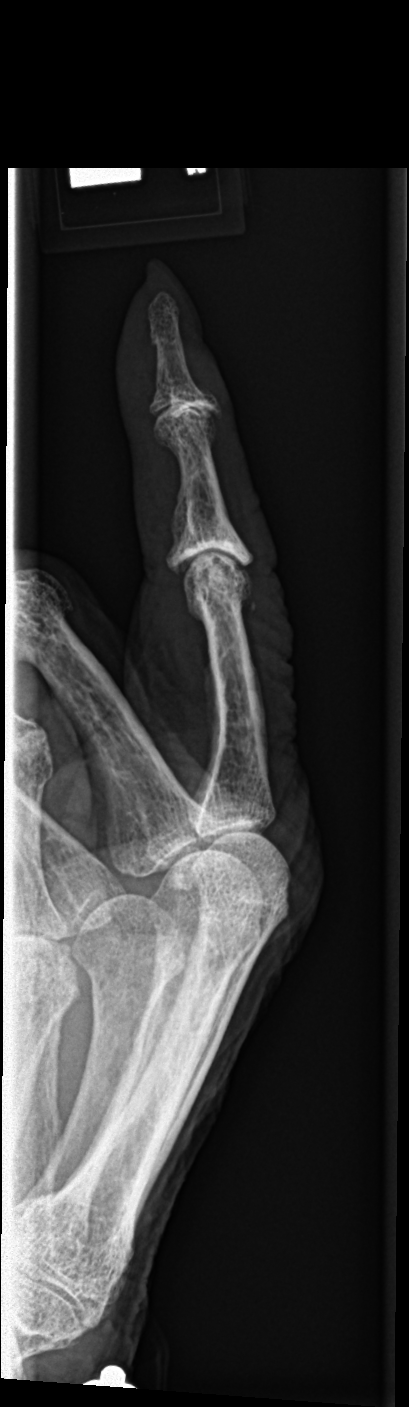

[finger obl]
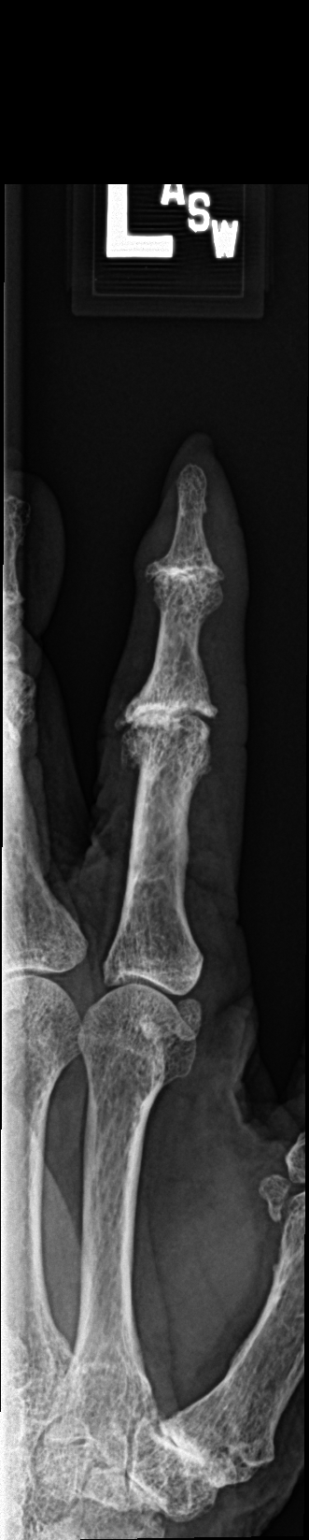

[3 of 3 positions shown; findings below may reference images not displayed]

FINDINGS: Negative for acute fracture, dislocation or radiopaque foreign body.
Osteoarthritic changes are present at the interphalangeal joints.
IMPRESSION: Negative.

## 2019-02-02 DEATH — deceased
